# Patient Record
Sex: Female | Born: 1970 | Race: Asian | Hispanic: No | Marital: Married | Smoking: Current every day smoker
Health system: Southern US, Community
[De-identification: ages and names within clinical notes are randomized; demographics above are authoritative.]

## PROBLEM LIST (undated history)

## (undated) DIAGNOSIS — J45909 Unspecified asthma, uncomplicated: Secondary | ICD-10-CM

## (undated) DIAGNOSIS — F3281 Premenstrual dysphoric disorder: Secondary | ICD-10-CM

## (undated) DIAGNOSIS — N809 Endometriosis, unspecified: Secondary | ICD-10-CM

## (undated) DIAGNOSIS — Q998 Other specified chromosome abnormalities: Secondary | ICD-10-CM

## (undated) HISTORY — DX: Premenstrual dysphoric disorder: F32.81

## (undated) HISTORY — PX: ENDOMETRIAL ABLATION: SHX621

## (undated) HISTORY — DX: Endometriosis, unspecified: N80.9

## (undated) HISTORY — DX: Unspecified asthma, uncomplicated: J45.909

## (undated) HISTORY — DX: Other specified chromosome abnormalities: Q99.8

## (undated) HISTORY — PX: SHOULDER SURGERY: SHX246

---
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
## 1988-06-26 HISTORY — PX: TUBAL LIGATION: SHX77

## 1997-07-29 ENCOUNTER — Ambulatory Visit (HOSPITAL_COMMUNITY): Admission: RE | Admit: 1997-07-29 | Discharge: 1997-07-29 | Payer: Self-pay | Admitting: Gynecology

## 1999-04-24 ENCOUNTER — Emergency Department (HOSPITAL_COMMUNITY): Admission: EM | Admit: 1999-04-24 | Discharge: 1999-04-24 | Payer: Self-pay | Admitting: Emergency Medicine

## 2001-11-13 ENCOUNTER — Other Ambulatory Visit: Admission: RE | Admit: 2001-11-13 | Discharge: 2001-11-13 | Payer: Self-pay | Admitting: Gynecology

## 2003-02-24 ENCOUNTER — Other Ambulatory Visit: Admission: RE | Admit: 2003-02-24 | Discharge: 2003-02-24 | Payer: Self-pay | Admitting: Gynecology

## 2004-03-01 ENCOUNTER — Other Ambulatory Visit: Admission: RE | Admit: 2004-03-01 | Discharge: 2004-03-01 | Payer: Self-pay | Admitting: Gynecology

## 2005-08-14 ENCOUNTER — Other Ambulatory Visit: Admission: RE | Admit: 2005-08-14 | Discharge: 2005-08-14 | Payer: Self-pay | Admitting: Gynecology

## 2007-02-15 ENCOUNTER — Other Ambulatory Visit: Admission: RE | Admit: 2007-02-15 | Discharge: 2007-02-15 | Payer: Self-pay | Admitting: Gynecology

## 2007-03-19 ENCOUNTER — Ambulatory Visit (HOSPITAL_COMMUNITY): Admission: RE | Admit: 2007-03-19 | Discharge: 2007-03-19 | Payer: Self-pay | Admitting: Gynecology

## 2007-04-08 ENCOUNTER — Ambulatory Visit: Payer: Self-pay | Admitting: Oncology

## 2007-05-28 ENCOUNTER — Ambulatory Visit: Payer: Self-pay | Admitting: Oncology

## 2007-05-30 LAB — CBC WITH DIFFERENTIAL/PLATELET
Basophils Absolute: 0.1 10*3/uL (ref 0.0–0.1)
HCT: 37 % (ref 34.8–46.6)
MCH: 21.8 pg — ABNORMAL LOW (ref 26.0–34.0)
MCHC: 32.9 g/dL (ref 32.0–36.0)
MCV: 66.3 fL — ABNORMAL LOW (ref 81.0–101.0)
NEUT#: 7.1 10*3/uL — ABNORMAL HIGH (ref 1.5–6.5)
NEUT%: 62.7 % (ref 39.6–76.8)
Platelets: 346 10*3/uL (ref 145–400)
WBC: 11.3 10*3/uL — ABNORMAL HIGH (ref 3.9–10.0)

## 2007-05-30 LAB — MORPHOLOGY

## 2007-05-31 LAB — COMPREHENSIVE METABOLIC PANEL
ALT: 11 U/L (ref 0–35)
Albumin: 4.5 g/dL (ref 3.5–5.2)
BUN: 10 mg/dL (ref 6–23)
Calcium: 9 mg/dL (ref 8.4–10.5)
Chloride: 106 mEq/L (ref 96–112)
Creatinine, Ser: 0.82 mg/dL (ref 0.40–1.20)
Potassium: 3.9 mEq/L (ref 3.5–5.3)
Sodium: 138 mEq/L (ref 135–145)
Total Bilirubin: 0.6 mg/dL (ref 0.3–1.2)
Total Protein: 8.3 g/dL (ref 6.0–8.3)

## 2007-05-31 LAB — ANA: Anti Nuclear Antibody(ANA): NEGATIVE

## 2007-05-31 LAB — SEDIMENTATION RATE: Sed Rate: 21 mm/hr (ref 0–22)

## 2007-12-03 ENCOUNTER — Ambulatory Visit: Payer: Self-pay | Admitting: Oncology

## 2007-12-06 LAB — CBC WITH DIFFERENTIAL/PLATELET
BASO%: 0.3 % (ref 0.0–2.0)
Basophils Absolute: 0 10e3/uL (ref 0.0–0.1)
EOS%: 2.4 % (ref 0.0–7.0)
Eosinophils Absolute: 0.3 10e3/uL (ref 0.0–0.5)
HCT: 38 % (ref 34.8–46.6)
HGB: 12.6 g/dL (ref 11.6–15.9)
LYMPH%: 26.3 % (ref 14.0–48.0)
MCH: 22 pg — ABNORMAL LOW (ref 26.0–34.0)
MCHC: 33.2 g/dL (ref 32.0–36.0)
MCV: 66.2 fL — ABNORMAL LOW (ref 81.0–101.0)
MONO#: 0.9 10e3/uL (ref 0.1–0.9)
MONO%: 6.7 % (ref 0.0–13.0)
NEUT#: 8.2 10e3/uL — ABNORMAL HIGH (ref 1.5–6.5)
NEUT%: 64.3 % (ref 39.6–76.8)
Platelets: 359 10e3/uL (ref 145–400)
RBC: 5.75 10e6/uL — ABNORMAL HIGH (ref 3.70–5.32)
RDW: 14.3 % (ref 11.3–14.5)
WBC: 12.7 10e3/uL — ABNORMAL HIGH (ref 3.9–10.0)
lymph#: 3.3 10e3/uL (ref 0.9–3.3)

## 2007-12-10 LAB — IRON AND TIBC: TIBC: 331 ug/dL (ref 250–470)

## 2008-01-13 ENCOUNTER — Ambulatory Visit: Payer: Self-pay | Admitting: Oncology

## 2008-05-20 ENCOUNTER — Other Ambulatory Visit: Admission: RE | Admit: 2008-05-20 | Discharge: 2008-05-20 | Payer: Self-pay | Admitting: Gynecology

## 2008-05-20 ENCOUNTER — Encounter: Payer: Self-pay | Admitting: Gynecology

## 2008-05-20 ENCOUNTER — Ambulatory Visit: Payer: Self-pay | Admitting: Gynecology

## 2008-12-08 ENCOUNTER — Ambulatory Visit: Payer: Self-pay | Admitting: Family Medicine

## 2008-12-08 DIAGNOSIS — R7301 Impaired fasting glucose: Secondary | ICD-10-CM | POA: Insufficient documentation

## 2008-12-08 DIAGNOSIS — R0789 Other chest pain: Secondary | ICD-10-CM | POA: Insufficient documentation

## 2008-12-09 ENCOUNTER — Ambulatory Visit: Payer: Self-pay | Admitting: Family Medicine

## 2008-12-11 ENCOUNTER — Telehealth (INDEPENDENT_AMBULATORY_CARE_PROVIDER_SITE_OTHER): Payer: Self-pay | Admitting: *Deleted

## 2008-12-11 LAB — CONVERTED CEMR LAB
BUN: 9 mg/dL (ref 6–23)
Basophils Relative: 0.3 % (ref 0.0–3.0)
CO2: 24 meq/L (ref 19–32)
Chloride: 102 meq/L (ref 96–112)
Cholesterol: 121 mg/dL (ref 0–200)
Creatinine, Ser: 0.7 mg/dL (ref 0.4–1.2)
Eosinophils Absolute: 0.1 10*3/uL (ref 0.0–0.7)
Eosinophils Relative: 1.8 % (ref 0.0–5.0)
GFR calc non Af Amer: 98.28 mL/min (ref >60)
Glucose, Bld: 103 mg/dL — ABNORMAL HIGH (ref 70–99)
HCT: 38.6 % (ref 36.0–46.0)
HDL: 26.6 mg/dL — ABNORMAL LOW (ref >39.00)
LDL Cholesterol: 65 mg/dL (ref 0–99)
Lymphs Abs: 2.2 10*3/uL (ref 0.7–4.0)
Neutro Abs: 5.3 10*3/uL (ref 1.4–7.7)
Platelets: 255 10*3/uL (ref 150.0–400.0)
Potassium: 4.2 meq/L (ref 3.5–5.1)
RDW: 13.1 % (ref 11.5–14.6)
Sodium: 139 meq/L (ref 135–145)
TSH: 1.48 microintl units/mL (ref 0.35–5.50)
Total Bilirubin: 0.7 mg/dL (ref 0.3–1.2)
Total CHOL/HDL Ratio: 5
Total Protein: 8.1 g/dL (ref 6.0–8.3)
Triglycerides: 146 mg/dL (ref 0.0–149.0)
VLDL: 29.2 mg/dL (ref 0.0–40.0)

## 2008-12-22 ENCOUNTER — Ambulatory Visit: Payer: Self-pay | Admitting: Internal Medicine

## 2008-12-22 DIAGNOSIS — F17200 Nicotine dependence, unspecified, uncomplicated: Secondary | ICD-10-CM

## 2008-12-22 DIAGNOSIS — R519 Headache, unspecified: Secondary | ICD-10-CM | POA: Insufficient documentation

## 2008-12-22 DIAGNOSIS — R51 Headache: Secondary | ICD-10-CM | POA: Insufficient documentation

## 2008-12-22 DIAGNOSIS — R42 Dizziness and giddiness: Secondary | ICD-10-CM | POA: Insufficient documentation

## 2009-09-15 ENCOUNTER — Ambulatory Visit: Payer: Self-pay | Admitting: Gynecology

## 2009-09-15 ENCOUNTER — Other Ambulatory Visit: Admission: RE | Admit: 2009-09-15 | Discharge: 2009-09-15 | Payer: Self-pay | Admitting: Gynecology

## 2009-09-21 ENCOUNTER — Ambulatory Visit (HOSPITAL_COMMUNITY): Admission: RE | Admit: 2009-09-21 | Discharge: 2009-09-21 | Payer: Self-pay | Admitting: Gynecology

## 2009-09-21 IMAGING — MG MM DIGITAL SCREENING
4 series · 4 of 4 positions shown · non-contrast
Comparison: none

DG SCREEN MAMMOGRAM BILATERAL
Bilateral CC and MLO view(s) were taken.
Technologist: SAMPA, RT, RM

DIGITAL SCREENING MAMMOGRAM WITH CAD:
There are scattered fibroglandular densities.  No masses or malignant type calcifications are 
identified.
Images were processed with CAD.

[R CC]
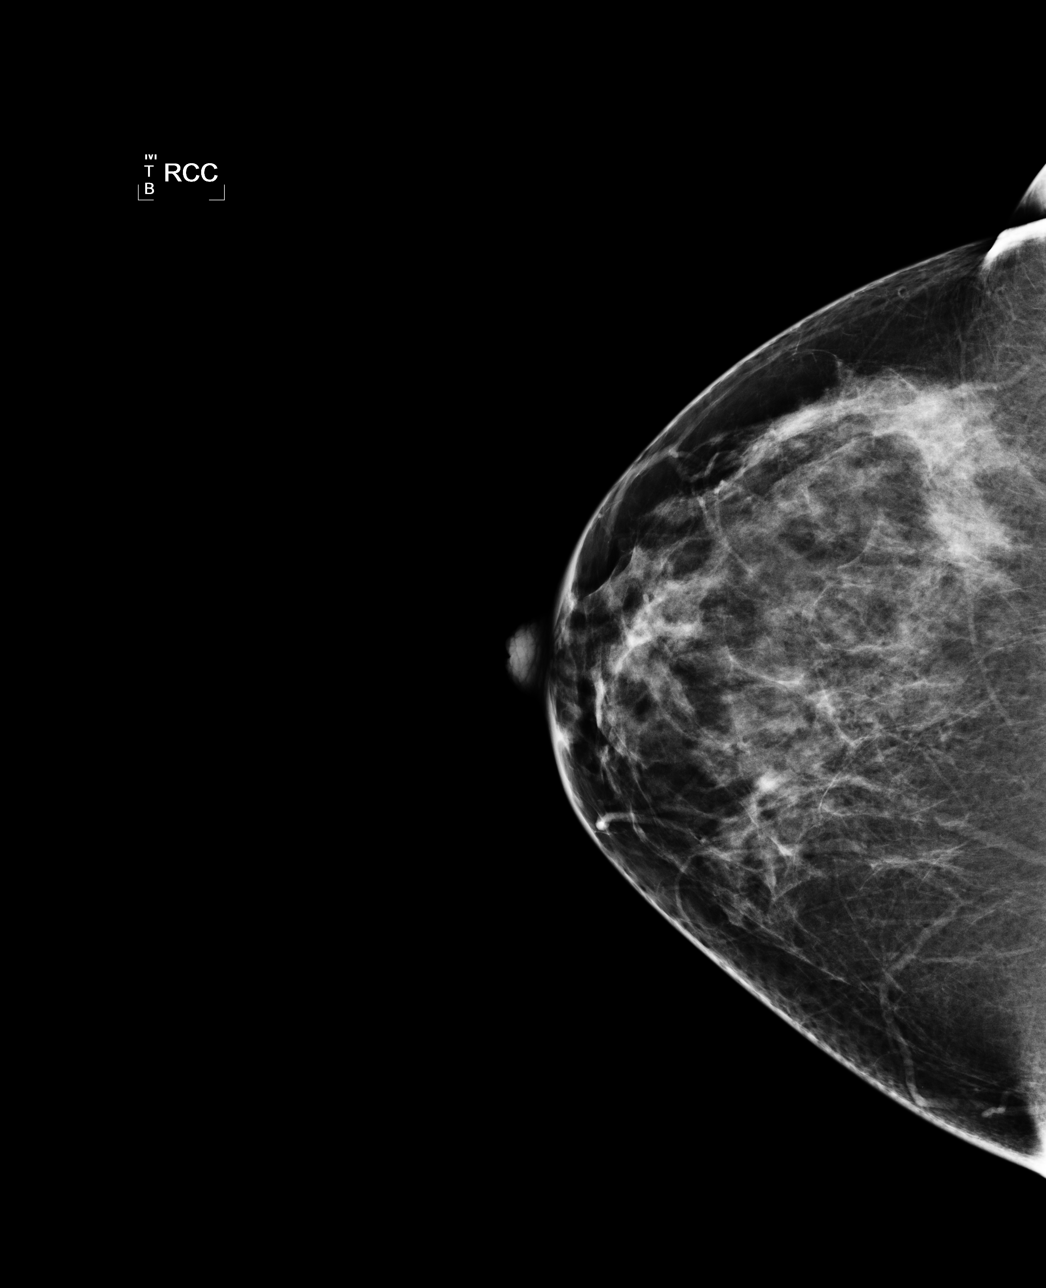

[R MLO]
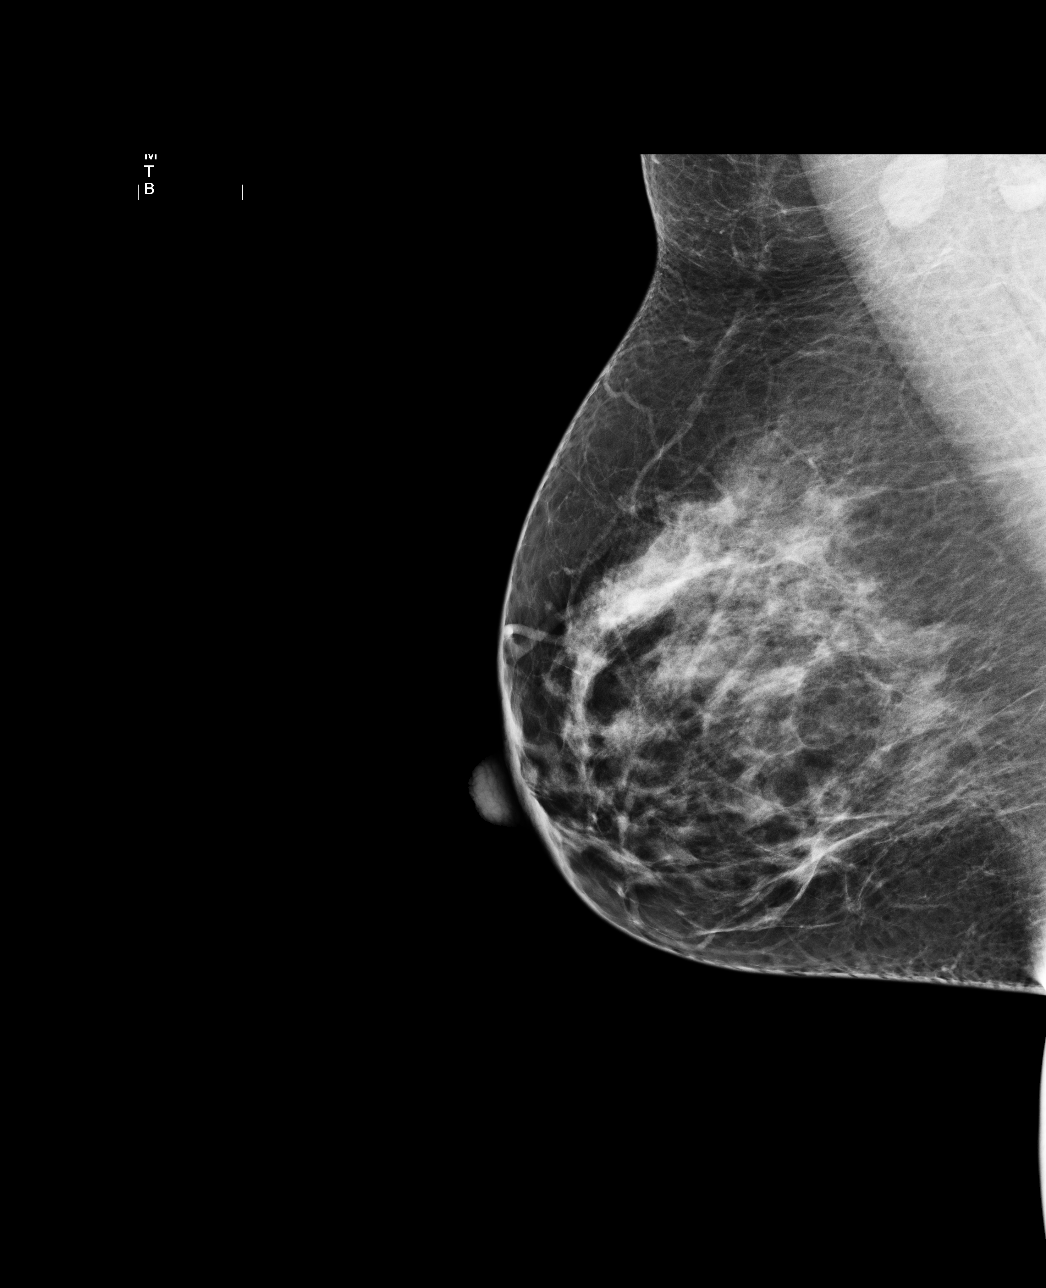

[L CC]
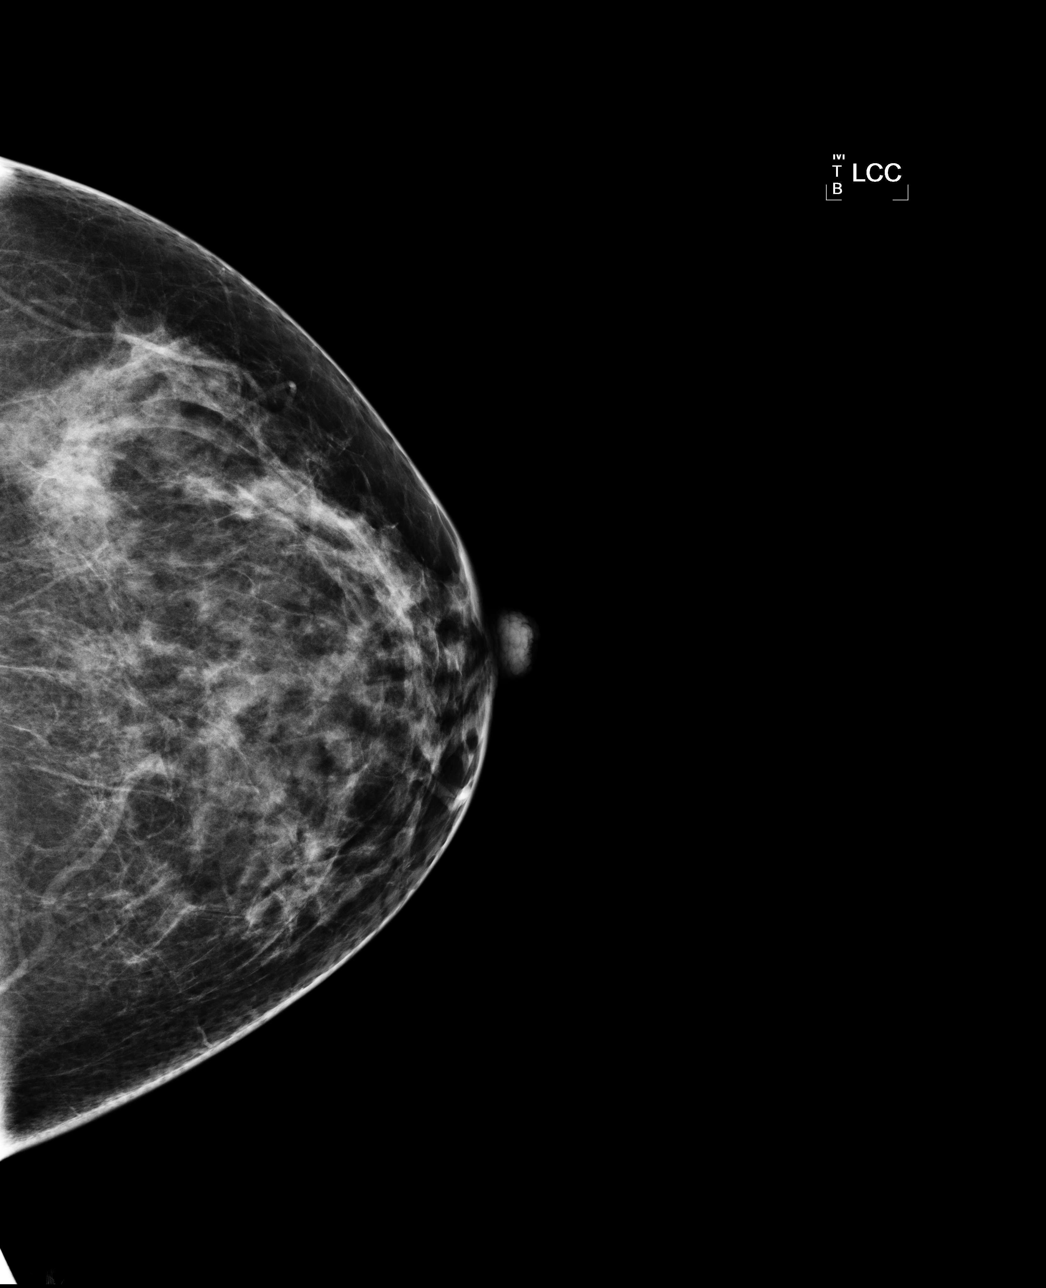

[L MLO]
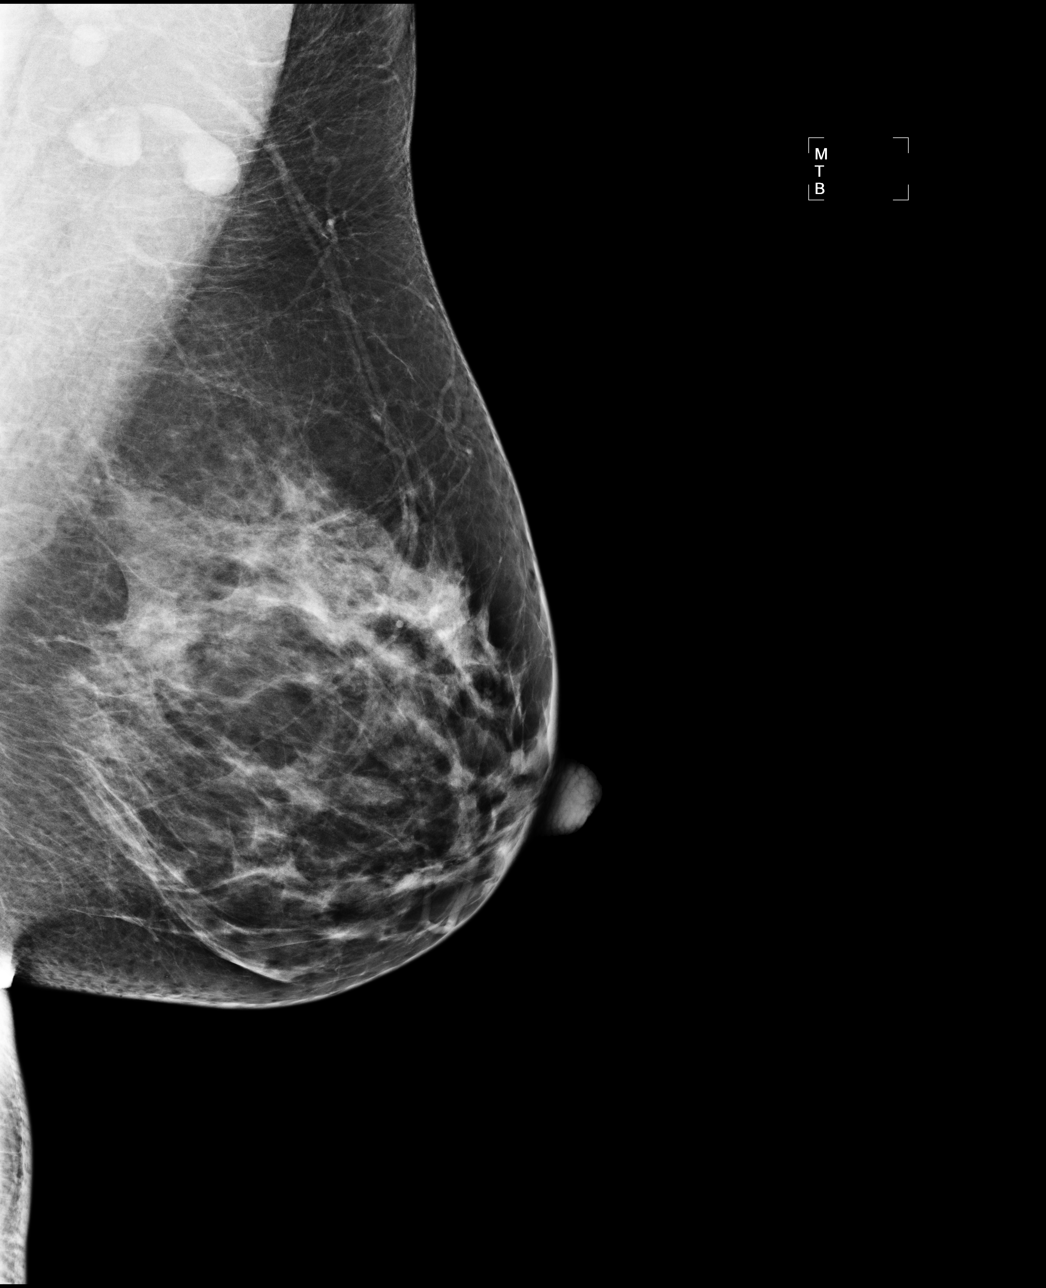

[4 of 4 positions shown; findings below may reference images not displayed]

IMPRESSION: No specific mammographic evidence of malignancy.  Next screening mammogram is recommended in one 
year.

A result letter of this screening mammogram will be mailed directly to the patient.

ASSESSMENT: Negative - BI-RADS 1

Screening mammogram in 1 year.
,

## 2010-07-18 ENCOUNTER — Encounter: Payer: Self-pay | Admitting: Internal Medicine

## 2010-09-26 ENCOUNTER — Encounter: Payer: Self-pay | Admitting: Gynecology

## 2010-10-17 ENCOUNTER — Other Ambulatory Visit (HOSPITAL_COMMUNITY)
Admission: RE | Admit: 2010-10-17 | Discharge: 2010-10-17 | Disposition: A | Discharge status: Home / Self Care | Payer: Private Health Insurance - Indemnity | Source: Ambulatory Visit | Attending: Gynecology | Admitting: Gynecology

## 2010-10-17 ENCOUNTER — Encounter (INDEPENDENT_AMBULATORY_CARE_PROVIDER_SITE_OTHER): Payer: Private Health Insurance - Indemnity | Admitting: Gynecology

## 2010-10-17 ENCOUNTER — Other Ambulatory Visit: Payer: Self-pay | Admitting: Gynecology

## 2010-10-17 DIAGNOSIS — R635 Abnormal weight gain: Secondary | ICD-10-CM

## 2010-10-17 DIAGNOSIS — Z01419 Encounter for gynecological examination (general) (routine) without abnormal findings: Secondary | ICD-10-CM

## 2010-10-17 DIAGNOSIS — Z13220 Encounter for screening for lipoid disorders: Secondary | ICD-10-CM

## 2010-10-17 DIAGNOSIS — Z833 Family history of diabetes mellitus: Secondary | ICD-10-CM

## 2010-10-17 DIAGNOSIS — N942 Vaginismus: Secondary | ICD-10-CM

## 2010-10-17 DIAGNOSIS — N831 Corpus luteum cyst of ovary, unspecified side: Secondary | ICD-10-CM

## 2010-10-17 DIAGNOSIS — Z124 Encounter for screening for malignant neoplasm of cervix: Secondary | ICD-10-CM | POA: Insufficient documentation

## 2011-03-02 ENCOUNTER — Other Ambulatory Visit: Payer: Self-pay | Admitting: Gynecology

## 2011-03-02 DIAGNOSIS — Z1231 Encounter for screening mammogram for malignant neoplasm of breast: Secondary | ICD-10-CM

## 2011-03-09 ENCOUNTER — Ambulatory Visit (HOSPITAL_COMMUNITY)
Admission: RE | Admit: 2011-03-09 | Discharge: 2011-03-09 | Disposition: A | Discharge status: Home / Self Care | Payer: Private Health Insurance - Indemnity | Source: Ambulatory Visit | Attending: Gynecology | Admitting: Gynecology

## 2011-03-09 DIAGNOSIS — Z1231 Encounter for screening mammogram for malignant neoplasm of breast: Secondary | ICD-10-CM | POA: Insufficient documentation

## 2012-05-30 ENCOUNTER — Other Ambulatory Visit: Payer: Self-pay | Admitting: Gynecology

## 2012-05-30 DIAGNOSIS — Z1231 Encounter for screening mammogram for malignant neoplasm of breast: Secondary | ICD-10-CM

## 2012-06-05 ENCOUNTER — Encounter: Payer: Self-pay | Admitting: Anesthesiology

## 2012-06-07 ENCOUNTER — Encounter: Payer: Private Health Insurance - Indemnity | Admitting: Gynecology

## 2012-06-10 ENCOUNTER — Ambulatory Visit (INDEPENDENT_AMBULATORY_CARE_PROVIDER_SITE_OTHER): Payer: Managed Care, Other (non HMO) | Admitting: Gynecology

## 2012-06-10 ENCOUNTER — Encounter: Payer: Self-pay | Admitting: Gynecology

## 2012-06-10 ENCOUNTER — Other Ambulatory Visit (HOSPITAL_COMMUNITY)
Admission: RE | Admit: 2012-06-10 | Discharge: 2012-06-10 | Disposition: A | Discharge status: Home / Self Care | Payer: Managed Care, Other (non HMO) | Source: Ambulatory Visit | Attending: Gynecology | Admitting: Gynecology

## 2012-06-10 VITALS — BP 110/72 | Ht 63.0 in | Wt 179.0 lb

## 2012-06-10 DIAGNOSIS — Z01419 Encounter for gynecological examination (general) (routine) without abnormal findings: Secondary | ICD-10-CM | POA: Insufficient documentation

## 2012-06-10 DIAGNOSIS — F419 Anxiety disorder, unspecified: Secondary | ICD-10-CM | POA: Insufficient documentation

## 2012-06-10 DIAGNOSIS — Z1151 Encounter for screening for human papillomavirus (HPV): Secondary | ICD-10-CM | POA: Insufficient documentation

## 2012-06-10 DIAGNOSIS — Z833 Family history of diabetes mellitus: Secondary | ICD-10-CM

## 2012-06-10 DIAGNOSIS — F329 Major depressive disorder, single episode, unspecified: Secondary | ICD-10-CM | POA: Insufficient documentation

## 2012-06-10 DIAGNOSIS — Q999 Chromosomal abnormality, unspecified: Secondary | ICD-10-CM | POA: Insufficient documentation

## 2012-06-10 DIAGNOSIS — Z23 Encounter for immunization: Secondary | ICD-10-CM

## 2012-06-10 DIAGNOSIS — F411 Generalized anxiety disorder: Secondary | ICD-10-CM

## 2012-06-10 MED ORDER — FLUOXETINE HCL 20 MG PO CAPS: 20.0000 mg | ORAL_CAPSULE | Freq: Every day | ORAL | Status: DC

## 2012-06-10 NOTE — Progress Notes (Signed)
Monica Lewis 18-Jun-1971 [id number]   History:    41 y.o.  for annual gyn exam who according to her record she has not been seen the office since 2012. We are in the process of obtaining her records that were sent for since it was rather thick and we only have part of her chart. She does state that many years ago she had a LEEP cervical conization for dysplasia and this will need to be reviewed. She does have a prior history of tubal sterilization procedure. She has had several family members and friends who have passed away this year and she has been somewhat depressed and anxious. 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 She has no suicidal ideation and has good family support. She was weighing 197 and is down to 179. She does have family history of diabetes. She has an aunt and first cousin who have had breast cancer. She does her monthly self breast examination. She is scheduled for a mammogram in the next couple weeks. She is going to have an employment labs done in next week. She has a history of endometrial ablation (hydrothermal ablation) several years ago.  Past medical history,surgical history, family history and social history were all reviewed and documented in the EPIC chart.  Gynecologic History Patient's last menstrual period was 05/25/2012. Contraception: tubal ligation Last Pap: 2012. Results were: normal Last mammogram: 2012. Results were: normal  Obstetric History OB History    Grav Para Term Preterm Abortions TAB SAB Ect Mult Living   4 3   1     1      # Outc Date GA Lbr Len/2nd Wgt Sex Del Anes PTL Lv   1 PAR         SB   2 PAR         SB   3 PAR            4 ABT                ROS: A ROS was performed and pertinent positives and negatives are included in the history.  GENERAL: No fevers or chills. HEENT: No change in vision, no earache, sore  throat or sinus congestion. NECK: No pain or stiffness. CARDIOVASCULAR: No chest pain or pressure. No palpitations. PULMONARY: No shortness of breath, cough or wheeze. GASTROINTESTINAL: No abdominal pain, nausea, vomiting or diarrhea, melena or bright red blood per rectum. GENITOURINARY: No urinary frequency, urgency, hesitancy or dysuria. MUSCULOSKELETAL: No joint or muscle pain, no back pain, no recent trauma. DERMATOLOGIC: No rash, no itching, no lesions. ENDOCRINE: No polyuria, polydipsia, no heat or cold intolerance. No recent change in weight. HEMATOLOGICAL: No anemia or easy bruising or bleeding. NEUROLOGIC: No headache, seizures, numbness, tingling or weakness. PSYCHIATRIC: No depression, no loss of interest in normal activity or change in sleep pattern.     Exam: chaperone present  BP 110/72  Ht 5\' 3"  (1.6 m)  Wt 179 lb (81.194 kg)  BMI 31.71 kg/m2  LMP 05/25/2012  Body mass index is 31.71 kg/(m^2).  General appearance : Well developed well nourished female. No acute distress HEENT: Neck supple, trachea midline, no carotid bruits, no thyroidmegaly Lungs: Clear to auscultation, no rhonchi or wheezes, or rib retractions  Heart: Regular rate and rhythm, no murmurs or gallops Breast:Examined in sitting  and supine position were symmetrical in appearance, no palpable masses or tenderness,  no skin retraction, no nipple inversion, no nipple discharge, no skin discoloration, no axillary or supraclavicular lymphadenopathy Abdomen: no palpable masses or tenderness, no rebound or guarding Extremities: no edema or skin discoloration or tenderness  Pelvic:  Bartholin, Urethra, Skene Glands: Within normal limits             Vagina: No gross lesions or discharge  Cervix: No gross lesions or discharge  Uterus  anteverted, normal size, shape and consistency, non-tender and mobile  Adnexa  Without masses or tenderness  Anus and perineum  normal   Rectovaginal  normal sphincter tone without  palpated masses or tenderness             Hemoccult not done     Assessment/Plan:  41 y.o. female for annual exam with depression and anxiety and due to multiple family members and friends were passed away this past year. Patient with past history of PMDD had done well on Prozac 20 mg daily. Prescription refill for Prozac was provided. We discussed the new Pap smear screening guidelines. Pap smear will be drawn today. Records requested to review the degree of dysplasia from several years ago to update our records. She will stand is a copy of her employment labs to [place] for our records. She was reminded to do her monthly self breast examination and to followup with her mammogram in the next couple weeks. We discussed the detrimental effects of smoking that she has started to pick up again recently. We discussed importance of calcium vitamin D and regular exercise for osteoporosis prevention. Patient received her flu vaccine today.    Ok Edwards MD, 11:11 AM 06/10/2012

## 2012-06-10 NOTE — Patient Instructions (Signed)
Inactivated Influenza Vaccine What You Need to Know WHY GET VACCINATED?  Influenza ("flu") is a contagious disease.  It is caused by the influenza virus, which can be spread by coughing, sneezing, or nasal secretions.  Anyone can get influenza, but rates of infection are highest among children. For most people, symptoms last only a few days. They include:  Fever or chills.  Sore throat.  Muscle aches.  Fatigue.  Cough.  Headache.  Runny or stuffy nose. Other illnesses can have the same symptoms and are often mistaken for influenza. Young children, people 65 and older, pregnant women, and people with certain health conditions, such as heart, lung or kidney disease, or a weakened immune system can get much sicker. Flu can cause high fever and pneumonia, and make existing medical conditions worse. It can cause diarrhea and seizures in children. Each year thousands of people die from influenza and even more require hospitalization. By getting flu vaccine, you can protect yourself from influenza and may also avoid spreading influenza to others. INACTIVATED INFLUENZA VACCINE There are 2 types of influenza vaccine:  Inactivated (killed) vaccine, the "flu shot", is given by injection with a needle.  Live, attenuated (weakened) influenza vaccine is sprayed into the nostrils. This vaccine is described in a separate Vaccine Information Statement. A "high-dose" inactivated influenza vaccine is available for people 65 years of age and older. Ask your doctor for more information.  Influenza viruses are always changing, so annual vaccination is recommended. Each year scientists try to match the viruses in the vaccine to those most likely to cause flu that year. Flu vaccine will not prevent disease from other viruses, including flu viruses not contained in the vaccine. It takes up to 2 weeks for protection to develop after the shot. Protection lasts about 1 year. Some inactivated influenza vaccine  contains a preservative called thimerosal. Thimerosal-free influenza vaccine is available. Ask your doctor for more information. WHO SHOULD GET INACTIVATED INFLUENZA VACCINE AND WHEN? WHO  All people 6 months of age and older should get flu vaccine.  Vaccination is especially important for people at higher risk of severe influenza and their close contacts, including healthcare personnel and close contacts of children younger than 6 months. WHEN Getting the vaccine as soon as it is available. This should provide protection if the flu season comes early. You can get the vaccine as long as illness is occurring in your community. Influenza can occur at any time, but most influenza occurs from October through May. In recent seasons, most infections have occurred in January and February. Getting vaccinated in December, or even later, will still be beneficial in most years. Adults and older children need 1 dose of influenza vaccine each year. But some children younger than 9 years of age need 2 doses to be protected. Ask your doctor. Influenza vaccine may be given at the same time as other vaccines, including pneumococcal vaccine. SOME PEOPLE SHOULD NOT GET INACTIVATED INFLUENZA VACCINE OR SHOULD WAIT  Tell your doctor if you have any severe (life-threatening) allergies, including a severe allergy to eggs. A severe allergy to any vaccine component may be a reason not to get the vaccine. Allergic reactions to influenza vaccine are rare.  Tell your doctor if you ever had a severe reaction after a dose of influenza vaccine.  Tell your doctor if you ever had Guillain-Barr syndrome (a severe paralytic illness, also called GBS). Your doctor will help you decide whether the vaccine is recommended for you.  People who are   moderately or severely ill should usually wait until they recover before getting the flu vaccine. If you are ill, talk to your doctor about whether to reschedule the vaccination. People with  a mild illness can usually get the vaccine. WHAT ARE THE RISKS FROM INACTIVATED INFLUENZA VACCINE? A vaccine, like any medicine, could possibly cause serious problems, such as severe allergic reactions. The risk of a vaccine causing serious harm, or death, is extremely small. Serious problems from inactivated influenza vaccine are very rare. The viruses in inactivated influenza vaccine have been killed, so you cannot get influenza from the vaccine. Mild problems:  Soreness, redness, or swelling where the shot was given.  Hoarseness; sore, red or itchy eyes; cough.  Fever.  Aches.  Headache.  Itching.  Fatigue. If these problems occur, they usually begin soon after the shot and last 1 to 2 days. Moderate problems: Young children who get inactivated flu vaccine and pneumococcal vaccine (PCV13) at the same time appear to be at increased risk for seizures caused by fever. Ask your doctor for more information. Tell your doctor if a child who is getting flu vaccine has ever had a seizure. Severe problems:  Life-threatening allergic reactions from vaccines are very rare. If they do occur, it is usually within a few minutes to a few hours after the shot.  In 1976, a type of inactivated influenza (swine flu) vaccine was associated with Guillan-Barr syndrome (GBS). Since then, flu vaccines have not been clearly linked to GBS. However, if there is a risk of GBS from current flu vaccines, it would be no more than 1 or 2 cases per million people vaccinated. This is much lower than the risk of severe influenza, which can be prevented by vaccination. The safety of vaccines is always being monitored. For more information, visit:  www.cdc.gov/vaccinesafety/Vaccine_Monitoring/Index.html and  www.cdc.gov/vaccinesafety/Activities/Activities_Index.html One brand of inactivated flu vaccine, called Afluria, should not be given to children 8 years of age or younger, except in special circumstances. A  related vaccine was associated with fevers and fever-related seizures in young children in [place]. Your doctor can give you more information. WHAT IF THERE IS A SEVERE REACTION? What should I look for? Any unusual condition, such as a high fever or unusual behavior. Signs of a serious allergic reaction can include difficulty breathing, hoarseness or wheezing, hives, paleness, weakness, a fast heartbeat, or dizziness. What should I do?  Call a doctor, or get the person to a doctor right away.  Tell your doctor what happened, the date and time it happened, and when the vaccination was given.  Ask your doctor, nurse, or health department to report the reaction by filing a Vaccine Adverse Event Reporting System (VAERS) form. Or, you can file this report through the VAERS website at www.vaers.hhs.gov or by calling 1-800-822-7967. VAERS does not provide medical advice. THE NATIONAL VACCINE INJURY COMPENSATION PROGRAM The National Vaccine Injury Compensation Program (VICP) was created in 1986. People who believe they may have been injured by a vaccine can learn about the program and about filing a claim by calling 1-800-338-2382, or visiting the VICP website at www.hrsa.gov/vaccinecompensation HOW CAN I LEARN MORE?  Ask your doctor. They can give you the vaccine package insert or suggest other sources of information.  Call your local or state health department.  Contact the Centers for Disease Control and Prevention (CDC):  Call 1-800-232-4636 (1-800-CDC-INFO) or  Visit the CDC's website at www.cdc.gov/flu CDC Inactivated Influenza Vaccine VIS (12/26/10) Document Released: 04/06/2006 Document Revised: 12/12/2011 Document Reviewed:   12/26/2010 [employer] Patient Information 2013 [employer].  

## 2012-06-24 ENCOUNTER — Ambulatory Visit (HOSPITAL_COMMUNITY)
Admission: RE | Admit: 2012-06-24 | Discharge: 2012-06-24 | Disposition: A | Discharge status: Home / Self Care | Payer: Managed Care, Other (non HMO) | Source: Ambulatory Visit | Attending: Gynecology | Admitting: Gynecology

## 2012-06-24 DIAGNOSIS — Z1231 Encounter for screening mammogram for malignant neoplasm of breast: Secondary | ICD-10-CM

## 2012-12-16 ENCOUNTER — Telehealth: Payer: Self-pay | Admitting: *Deleted

## 2012-12-16 DIAGNOSIS — R7301 Impaired fasting glucose: Secondary | ICD-10-CM

## 2012-12-16 NOTE — Telephone Encounter (Signed)
Have her come in fasting and we will do a hemoglobin A1c which will giving an average of her blood sugars over the course of the past 3 months more accurately

## 2012-12-16 NOTE — Telephone Encounter (Signed)
Pt has a recent blood work done by [employer] [facility] thru [employer] (job) her glucose came back reading 109 this is with a 14 hour fasting with no food only drinking water. Last glucose reading done in Dec. 2013 was 93. She has no PCP, pt asked if you want to recheck here? Please advise

## 2012-12-17 NOTE — Telephone Encounter (Signed)
Pt informed with the below note, order placed, pt will come on 12/23/12 @ 8:45 am

## 2012-12-23 ENCOUNTER — Other Ambulatory Visit: Payer: Managed Care, Other (non HMO)

## 2012-12-23 DIAGNOSIS — R7301 Impaired fasting glucose: Secondary | ICD-10-CM

## 2012-12-23 LAB — HEMOGLOBIN A1C: Mean Plasma Glucose: 111 mg/dL (ref <117)

## 2013-01-03 ENCOUNTER — Other Ambulatory Visit: Payer: Self-pay | Admitting: Gynecology

## 2013-05-01 ENCOUNTER — Other Ambulatory Visit: Payer: Self-pay

## 2013-08-07 ENCOUNTER — Other Ambulatory Visit: Payer: Self-pay | Admitting: Gynecology

## 2013-09-04 ENCOUNTER — Encounter: Payer: Self-pay | Admitting: Gynecology

## 2013-09-06 ENCOUNTER — Other Ambulatory Visit: Payer: Self-pay | Admitting: Gynecology

## 2013-10-09 ENCOUNTER — Ambulatory Visit (INDEPENDENT_AMBULATORY_CARE_PROVIDER_SITE_OTHER): Payer: Managed Care, Other (non HMO) | Admitting: Gynecology

## 2013-10-09 ENCOUNTER — Encounter: Payer: Self-pay | Admitting: Gynecology

## 2013-10-09 ENCOUNTER — Other Ambulatory Visit (HOSPITAL_COMMUNITY)
Admission: RE | Admit: 2013-10-09 | Discharge: 2013-10-09 | Disposition: A | Discharge status: Home / Self Care | Payer: Managed Care, Other (non HMO) | Source: Ambulatory Visit | Attending: Gynecology | Admitting: Gynecology

## 2013-10-09 VITALS — BP 114/70 | Ht 63.75 in | Wt 178.0 lb

## 2013-10-09 DIAGNOSIS — Z1151 Encounter for screening for human papillomavirus (HPV): Secondary | ICD-10-CM | POA: Insufficient documentation

## 2013-10-09 DIAGNOSIS — Z01419 Encounter for gynecological examination (general) (routine) without abnormal findings: Secondary | ICD-10-CM | POA: Insufficient documentation

## 2013-10-09 DIAGNOSIS — Z87410 Personal history of cervical dysplasia: Secondary | ICD-10-CM

## 2013-10-09 DIAGNOSIS — Q999 Chromosomal abnormality, unspecified: Secondary | ICD-10-CM | POA: Insufficient documentation

## 2013-10-09 DIAGNOSIS — N951 Menopausal and female climacteric states: Secondary | ICD-10-CM | POA: Insufficient documentation

## 2013-10-09 LAB — CBC WITH DIFFERENTIAL/PLATELET
BASOS ABS: 0 10*3/uL (ref 0.0–0.1)
Basophils Relative: 0 % (ref 0–1)
Eosinophils Absolute: 0.7 10*3/uL (ref 0.0–0.7)
Eosinophils Relative: 6 % — ABNORMAL HIGH (ref 0–5)
HEMATOCRIT: 40.1 % (ref 36.0–46.0)
HEMOGLOBIN: 13.4 g/dL (ref 12.0–15.0)
LYMPHS PCT: 32 % (ref 12–46)
Lymphs Abs: 3.7 10*3/uL (ref 0.7–4.0)
MCH: 21.9 pg — AB (ref 26.0–34.0)
MCHC: 33.4 g/dL (ref 30.0–36.0)
MCV: 65.5 fL — AB (ref 78.0–100.0)
Monocytes Absolute: 0.7 10*3/uL (ref 0.1–1.0)
Monocytes Relative: 6 % (ref 3–12)
NEUTROS ABS: 6.6 10*3/uL (ref 1.7–7.7)
NEUTROS PCT: 56 % (ref 43–77)
Platelets: 352 10*3/uL (ref 150–400)
RBC: 6.12 MIL/uL — AB (ref 3.87–5.11)
RDW: 16 % — ABNORMAL HIGH (ref 11.5–15.5)
WBC: 11.7 10*3/uL — ABNORMAL HIGH (ref 4.0–10.5)

## 2013-10-09 LAB — COMPREHENSIVE METABOLIC PANEL
ALT: 18 U/L (ref 0–35)
AST: 19 U/L (ref 0–37)
Albumin: 4.7 g/dL (ref 3.5–5.2)
Alkaline Phosphatase: 56 U/L (ref 39–117)
BUN: 12 mg/dL (ref 6–23)
CO2: 27 mEq/L (ref 19–32)
Calcium: 9.7 mg/dL (ref 8.4–10.5)
Chloride: 102 mEq/L (ref 96–112)
Creat: 0.65 mg/dL (ref 0.50–1.10)
GLUCOSE: 81 mg/dL (ref 70–99)
POTASSIUM: 4 meq/L (ref 3.5–5.3)
Sodium: 138 mEq/L (ref 135–145)
Total Bilirubin: 0.4 mg/dL (ref 0.2–1.2)
Total Protein: 8.4 g/dL — ABNORMAL HIGH (ref 6.0–8.3)

## 2013-10-09 LAB — TSH: TSH: 1.312 u[IU]/mL (ref 0.350–4.500)

## 2013-10-09 LAB — FOLLICLE STIMULATING HORMONE: FSH: 27.6 m[IU]/mL

## 2013-10-09 MED ORDER — TRIAMCINOLONE ACETONIDE 55 MCG/ACT NA AERO: 2.0000 | INHALATION_SPRAY | Freq: Every day | NASAL | Status: AC

## 2013-10-09 MED ORDER — FLUOXETINE HCL 20 MG PO CAPS: 20.0000 mg | ORAL_CAPSULE | Freq: Every day | ORAL | Status: DC

## 2013-10-09 NOTE — Patient Instructions (Addendum)
Smoking Cessation Quitting smoking is important to your health and has many advantages. However, it is not always easy to quit since nicotine is a very addictive drug. Often times, people try 3 times or more before being able to quit. This document explains the best ways for you to prepare to quit smoking. Quitting takes hard work and a lot of effort, but you can do it. ADVANTAGES OF QUITTING SMOKING  You will live longer, feel better, and live better.  Your body will feel the impact of quitting smoking almost immediately.  Within 20 minutes, blood pressure decreases. Your pulse returns to its normal level.  After 8 hours, carbon monoxide levels in the blood return to normal. Your oxygen level increases.  After 24 hours, the chance of having a heart attack starts to decrease. Your breath, hair, and body stop smelling like smoke.  After 48 hours, damaged nerve endings begin to recover. Your sense of taste and smell improve.  After 72 hours, the body is virtually free of nicotine. Your bronchial tubes relax and breathing becomes easier.  After 2 to 12 weeks, lungs can hold more air. Exercise becomes easier and circulation improves.  The risk of having a heart attack, stroke, cancer, or lung disease is greatly reduced.  After 1 year, the risk of coronary heart disease is cut in half.  After 5 years, the risk of stroke falls to the same as a nonsmoker.  After 10 years, the risk of lung cancer is cut in half and the risk of other cancers decreases significantly.  After 15 years, the risk of coronary heart disease drops, usually to the level of a nonsmoker.  If you are pregnant, quitting smoking will improve your chances of having a healthy baby.  The people you live with, especially any children, will be healthier.  You will have extra money to spend on things other than cigarettes. QUESTIONS TO THINK ABOUT BEFORE ATTEMPTING TO QUIT You may want to talk about your answers with your  caregiver.  Why do you want to quit?  If you tried to quit in the past, what helped and what did not?  What will be the most difficult situations for you after you quit? How will you plan to handle them?  Who can help you through the tough times? Your family? Friends? A caregiver?  What pleasures do you get from smoking? What ways can you still get pleasure if you quit? Here are some questions to ask your caregiver:  How can you help me to be successful at quitting?  What medicine do you think would be best for me and how should I take it?  What should I do if I need more help?  What is smoking withdrawal like? How can I get information on withdrawal? GET READY  Set a quit date.  Change your environment by getting rid of all cigarettes, ashtrays, matches, and lighters in your home, car, or work. Do not let people smoke in your home.  Review your past attempts to quit. Think about what worked and what did not. GET SUPPORT AND ENCOURAGEMENT You have a better chance of being successful if you have help. You can get support in many ways.  Tell your family, friends, and co-workers that you are going to quit and need their support. Ask them not to smoke around you.  Get individual, group, or telephone counseling and support. Programs are available at local hospitals and health centers. Call your local health department for   information about programs in your area.  Spiritual beliefs and practices may help some smokers quit.  Download a "quit meter" on your computer to keep track of quit statistics, such as how long you have gone without smoking, cigarettes not smoked, and money saved.  Get a self-help book about quitting smoking and staying off of tobacco. LEARN NEW SKILLS AND BEHAVIORS  Distract yourself from urges to smoke. Talk to someone, go for a walk, or occupy your time with a task.  Change your normal routine. Take a different route to work. Drink tea instead of coffee.  Eat breakfast in a different place.  Reduce your stress. Take a hot bath, exercise, or read a book.  Plan something enjoyable to do every day. Reward yourself for not smoking.  Explore interactive web-based programs that specialize in helping you quit. GET MEDICINE AND USE IT CORRECTLY Medicines can help you stop smoking and decrease the urge to smoke. Combining medicine with the above behavioral methods and support can greatly increase your chances of successfully quitting smoking.  Nicotine replacement therapy helps deliver nicotine to your body without the negative effects and risks of smoking. Nicotine replacement therapy includes nicotine gum, lozenges, inhalers, nasal sprays, and skin patches. Some may be available over-the-counter and others require a prescription.  Antidepressant medicine helps people abstain from smoking, but how this works is unknown. This medicine is available by prescription.  Nicotinic receptor partial agonist medicine simulates the effect of nicotine in your brain. This medicine is available by prescription. Ask your caregiver for advice about which medicines to use and how to use them based on your health history. Your caregiver will tell you what side effects to look out for if you choose to be on a medicine or therapy. Carefully read the information on the package. Do not use any other product containing nicotine while using a nicotine replacement product.  RELAPSE OR DIFFICULT SITUATIONS Most relapses occur within the first 3 months after quitting. Do not be discouraged if you start smoking again. Remember, most people try several times before finally quitting. You may have symptoms of withdrawal because your body is used to nicotine. You may crave cigarettes, be irritable, feel very hungry, cough often, get headaches, or have difficulty concentrating. The withdrawal symptoms are only temporary. They are strongest when you first quit, but they will go away within  10 14 days. To reduce the chances of relapse, try to:  Avoid drinking alcohol. Drinking lowers your chances of successfully quitting.  Reduce the amount of caffeine you consume. Once you quit smoking, the amount of caffeine in your body increases and can give you symptoms, such as a rapid heartbeat, sweating, and anxiety.  Avoid smokers because they can make you want to smoke.  Do not let weight gain distract you. Many smokers will gain weight when they quit, usually less than 10 pounds. Eat a healthy diet and stay active. You can always lose the weight gained after you quit.  Find ways to improve your mood other than smoking. FOR MORE INFORMATION  www.smokefree.gov  Document Released: 06/06/2001 Document Revised: 12/12/2011 Document Reviewed: 09/21/2011 [facility] Patient Information 2014 [employer], [place]. Triamcinolone nasal spray What is this medicine? TRIAMCINOLONE (trye am [place]) nasal spray is a corticosteroid. It is used to treat the nasal symptoms of seasonal and year round allergies. This medicine may be used for other purposes; ask your health care provider or pharmacist if you have questions. COMMON BRAND NAME(S): Nasacort AQ, Nasacort What  should I tell my health care provider before I take this medicine? They need to know if you have any of these conditions: -infection, like tuberculosis, herpes, or fungal infection -recent surgery or injury of nose or sinuses -taking corticosteroids by mouth -an unusual or allergic reaction to triamcinolone, corticosteroids, other medicines, foods, dyes, or preservatives -pregnant or trying to get pregnant -breast-feeding How should I use this medicine? This medicine is for use in the nose. Follow the directions on your prescription label. This medicine works best if used regularly. Do not use more often than directed. Make sure that you are using your nasal spray correctly. Ask you doctor or health care provider if you have any  questions. Talk to your pediatrician regarding the use of this medicine in children. While this drug may be prescribed for children as young as 522 years of age for selected conditions, precautions do apply. Overdosage: If you think you have taken too much of this medicine contact a poison control center or emergency room at once. NOTE: This medicine is only for you. Do not share this medicine with others. What if I miss a dose? If you miss a dose, take it as soon as you can. If it is almost time for your next dose, take only that dose. Do not take double or extra doses. What may interact with this medicine? Interactions are not expected. This list may not describe all possible interactions. Give your health care provider a list of all the medicines, herbs, non-prescription drugs, or dietary supplements you use. Also tell them if you smoke, drink alcohol, or use illegal drugs. Some items may interact with your medicine. What should I watch for while using this medicine? Check with your doctor or health care professional if your symptoms do not improve in 1 week of regular use or if they get worse. Do not come in contact with people who have chickenpox or the measles while you are taking this medicine. If you do, call your doctor right away. What side effects may I notice from receiving this medicine? Side effects that you should report to your doctor or health care professional as soon as possible: -allergic reactions like skin rash, itching or hives, swelling of the face, lips, or tongue -change in vision -dizziness -infection -nosebleed, burning in the nose -trouble breathing, wheezing -unusual bruising -white patches or sores in the nose Side effects that usually do not require medical attention (report to your doctor or health care professional if they continue or are bothersome): -congestion -cough -headache -nausea -runny nose -sneezing This list may not describe all possible side  effects. Call your doctor for medical advice about side effects. You may report side effects to FDA at 1-800-FDA-1088. Where should I keep my medicine? Keep out of the reach of children. Store at room temperature between 20 and 25 degrees C (68 and 77 degrees F). Throw away the canister after 120 sprays or after the expiration date, whichever comes first. NOTE: This sheet is a summary. It may not cover all possible information. If you have questions about this medicine, talk to your doctor, pharmacist, or health care provider.  2014, [employer]vier/[employer] Standard. (2008-06-04 15:44:00)

## 2013-10-09 NOTE — Progress Notes (Signed)
Monica Lewis 09/01/1970 [id number]   History:    43 y.o.  for annual gyn exam with some basal motor symptoms reported as well as seasonal allergy.  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 Subsequent Pap smears have been normal.  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 Patient's had hydrothermal ablation for menorrhagia in the past as well after her tubal ligation.  Patient has suffered from PMDD and has done well on Prozac 20 mg daily.  She does have family history of diabetes. She has an aunt and first cousin who have had breast cancer. She does her monthly self breast examination   Past medical history,surgical history, family history and social history were all reviewed and documented in the EPIC chart.  Gynecologic History Patient's last menstrual period was 09/24/2013. Contraception: tubal ligation Last Pap: 2013. Results were: normal Last mammogram 2014:  . Results were: normal  Obstetric History OB History  Gravida Para Term Preterm AB SAB TAB Ectopic Multiple Living  4 3   1     1     # Outcome Date GA Lbr Len/2nd Weight Sex Delivery Anes PTL Lv  4 ABT           3 PAR           2 PAR         SB  1 PAR         SB       ROS: A ROS was performed and pertinent positives and negatives are included in the history.  GENERAL: No fevers or chills. HEENT: No change in vision, no earache, sore throat or sinus congestion. NECK: No pain or stiffness. CARDIOVASCULAR: No chest pain or pressure. No palpitations. PULMONARY: No shortness of breath, cough or wheeze. GASTROINTESTINAL: No abdominal pain, nausea, vomiting or diarrhea, melena or bright red blood per rectum. GENITOURINARY: No urinary frequency, urgency, hesitancy or dysuria. MUSCULOSKELETAL: No joint or muscle pain, no back pain, no recent trauma. DERMATOLOGIC: No  rash, no itching, no lesions. ENDOCRINE: No polyuria, polydipsia, no heat or cold intolerance. No recent change in weight. HEMATOLOGICAL: No anemia or easy bruising or bleeding. NEUROLOGIC: No headache, seizures, numbness, tingling or weakness. PSYCHIATRIC: No depression, no loss of interest in normal activity or change in sleep pattern.     Exam: chaperone present  BP 114/70  Ht 5' 3.75" (1.619 m)  Wt 178 lb (80.74 kg)  BMI 30.80 kg/m2  LMP 09/24/2013  Body mass index is 30.8 kg/(m^2).  General appearance : Well developed well nourished female. No acute distress HEENT: Neck supple, trachea midline, no carotid bruits, no thyroidmegaly Lungs: Clear to auscultation, no rhonchi or wheezes, or rib retractions  Heart: Regular rate and rhythm, no murmurs or gallops Breast:Examined in sitting and supine position were symmetrical in appearance, no palpable masses or tenderness,  no skin retraction, no nipple inversion, no nipple discharge, no skin discoloration, no axillary or supraclavicular lymphadenopathy Abdomen: no palpable masses or tenderness, no rebound or guarding Extremities: no edema or skin discoloration or tenderness  Pelvic:  Bartholin, Urethra, Skene Glands: Within normal limits             Vagina: No gross lesions or discharge  Cervix: No gross lesions or discharge  Uterus  anteverted, normal size, shape and consistency, non-tender and mobile  Adnexa  Without masses or tenderness  Anus and perineum  normal   Rectovaginal  normal sphincter tone without palpated masses or tenderness             Hemoccult not indicated     Assessment/Plan:  43 y.o. female for annual exam done well with seasonal allergies will be placed on Nasacort to apply 2 squirts each nostril daily when necessary. She will get her fasting lipid profile at work and sent as a result. We will do her Pap smear today along with her CBC, urinalysis, TSH [facility] and she started to have some hot  flushes. She was reminded her monthly breast exams. We discussed importance of calcium vitamin D for osteoporosis prevention.  Note: This dictation was prepared with  Dragon/digital dictation along withSmart phrase technology. Any transcriptional errors that result from this process are unintentional.   Ok EdwardsJuan H Fernandez MD, 10:40 AM 10/09/2013

## 2013-10-09 NOTE — Addendum Note (Signed)
Addended by: Richardson ChiquitoWILKINSON, Moniqua Engebretsen S on: 10/09/2013 02:51 PM   Modules accepted: Orders

## 2013-10-10 DIAGNOSIS — D563 Thalassemia minor: Secondary | ICD-10-CM | POA: Insufficient documentation

## 2013-10-10 LAB — URINALYSIS W MICROSCOPIC + REFLEX CULTURE
Bacteria, UA: NONE SEEN
Bilirubin Urine: NEGATIVE
Casts: NONE SEEN
Crystals: NONE SEEN
Glucose, UA: NEGATIVE mg/dL
Hgb urine dipstick: NEGATIVE
Ketones, ur: NEGATIVE mg/dL
Leukocytes, UA: NEGATIVE
Nitrite: NEGATIVE
Protein, ur: NEGATIVE mg/dL
Specific Gravity, Urine: <1.005 — ABNORMAL LOW (ref 1.005–1.030)
Squamous Epithelial / LPF: NONE SEEN
Urobilinogen, UA: 0.2 mg/dL (ref 0.0–1.0)
pH: 7 (ref 5.0–8.0)

## 2013-10-10 NOTE — Progress Notes (Signed)
No now that we have that note that clarifies thinks thank you

## 2013-10-14 ENCOUNTER — Encounter: Payer: Self-pay | Admitting: Gynecology

## 2013-10-15 ENCOUNTER — Telehealth: Payer: Self-pay

## 2013-10-15 NOTE — Telephone Encounter (Signed)
No, this will suffice. Thanks

## 2013-10-15 NOTE — Telephone Encounter (Signed)
Message copied by Keenan BachelorANNAS, Kordelia Severin R on Wed Oct 15, 2013 12:59 PM ------      Message from: Ok EdwardsFERNANDEZ, JUAN H      Created: Fri Oct 10, 2013  8:07 AM       We'll need to repeat CBC in 2 weeks WBC slightly elevated. ------

## 2013-10-15 NOTE — Telephone Encounter (Signed)
Patient said 5 years ago you sent her to Dr. Cyndie ChimeGranfortuna to be worked up for elevated WBC and this is actually normal for her. I called and had the notes sent over from that visit. It was actually 06/12/2007. Dr. Cyndie ChimeGranfortuna wrote "I reassured her at this point in time, what we are likely seeing is just a high-normal count. No further interventions necessary. 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 Cyndie ChimeGranfortuna wrote that all was stable and "At this point, I think she can be followed exclusively by her primary care physician". I will have these notes scanned in her chart.   Patient asked if you still wanted her to recheck lab?

## 2013-10-24 ENCOUNTER — Telehealth: Payer: Self-pay | Admitting: *Deleted

## 2013-10-24 MED ORDER — ALBUTEROL SULFATE HFA 108 (90 BASE) MCG/ACT IN AERS: 2.0000 | INHALATION_SPRAY | Freq: Four times a day (QID) | RESPIRATORY_TRACT | Status: DC | PRN

## 2013-10-24 NOTE — Telephone Encounter (Signed)
Who has been prescribing it before? PCP?

## 2013-10-24 NOTE — Telephone Encounter (Signed)
Pt was seen on 10/09/13 for annual forgot to ask if you would be willing to refill ProAir HFA inhaler? Please advise

## 2013-10-24 NOTE — Telephone Encounter (Signed)
Please go ahead and refill her air ProAi HFA inhlaer

## 2013-10-24 NOTE — Telephone Encounter (Signed)
rx sent, pt informed.  

## 2013-10-24 NOTE — Telephone Encounter (Signed)
Pt does not have PCP, usually goes to walk in clinic to get Rx which is more expensive. Pt said she only takes during allergic season.

## 2014-04-27 ENCOUNTER — Encounter: Payer: Self-pay | Admitting: Gynecology

## 2014-10-15 ENCOUNTER — Other Ambulatory Visit: Payer: Self-pay

## 2014-10-15 MED ORDER — FLUOXETINE HCL 20 MG PO CAPS: 20.0000 mg | ORAL_CAPSULE | Freq: Every day | ORAL | Status: DC

## 2014-11-02 ENCOUNTER — Other Ambulatory Visit: Payer: Self-pay | Admitting: Gynecology

## 2014-11-22 ENCOUNTER — Other Ambulatory Visit: Payer: Self-pay | Admitting: Gynecology

## 2014-11-24 NOTE — Telephone Encounter (Signed)
Needs overdue exam

## 2014-11-24 NOTE — Telephone Encounter (Signed)
See 10/24/2013 telephone encounter where you refilled this for her. No PCP.

## 2014-11-27 ENCOUNTER — Other Ambulatory Visit: Payer: Self-pay | Admitting: Gynecology

## 2014-11-30 ENCOUNTER — Other Ambulatory Visit: Payer: Self-pay | Admitting: Gynecology

## 2014-12-08 ENCOUNTER — Other Ambulatory Visit: Payer: Self-pay | Admitting: Gynecology

## 2014-12-08 NOTE — Telephone Encounter (Signed)
No refills. Patient overdue for annual exam

## 2014-12-08 NOTE — Telephone Encounter (Signed)
Rx called in 

## 2014-12-08 NOTE — Telephone Encounter (Signed)
Annual scheduled on 12/25/14. Please advise

## 2014-12-23 ENCOUNTER — Encounter: Payer: Self-pay | Admitting: Gynecology

## 2014-12-23 ENCOUNTER — Ambulatory Visit (INDEPENDENT_AMBULATORY_CARE_PROVIDER_SITE_OTHER): Payer: Managed Care, Other (non HMO) | Admitting: Gynecology

## 2014-12-23 ENCOUNTER — Other Ambulatory Visit (HOSPITAL_COMMUNITY)
Admission: RE | Admit: 2014-12-23 | Discharge: 2014-12-23 | Disposition: A | Discharge status: Home / Self Care | Payer: Managed Care, Other (non HMO) | Source: Ambulatory Visit | Attending: Gynecology | Admitting: Gynecology

## 2014-12-23 VITALS — BP 124/80 | Ht 63.0 in | Wt 193.0 lb

## 2014-12-23 DIAGNOSIS — N951 Menopausal and female climacteric states: Secondary | ICD-10-CM | POA: Diagnosis not present

## 2014-12-23 DIAGNOSIS — Z01419 Encounter for gynecological examination (general) (routine) without abnormal findings: Secondary | ICD-10-CM | POA: Insufficient documentation

## 2014-12-23 DIAGNOSIS — Z79890 Hormone replacement therapy (postmenopausal): Secondary | ICD-10-CM | POA: Diagnosis not present

## 2014-12-23 DIAGNOSIS — R232 Flushing: Secondary | ICD-10-CM

## 2014-12-23 LAB — COMPREHENSIVE METABOLIC PANEL
ALT: 11 U/L (ref 0–35)
AST: 13 U/L (ref 0–37)
Albumin: 3.9 g/dL (ref 3.5–5.2)
Alkaline Phosphatase: 59 U/L (ref 39–117)
BILIRUBIN TOTAL: 0.3 mg/dL (ref 0.2–1.2)
BUN: 4 mg/dL — ABNORMAL LOW (ref 6–23)
CO2: 24 mEq/L (ref 19–32)
Calcium: 8.6 mg/dL (ref 8.4–10.5)
Chloride: 104 mEq/L (ref 96–112)
Creat: 0.51 mg/dL (ref 0.50–1.10)
GLUCOSE: 98 mg/dL (ref 70–99)
Potassium: 3.7 mEq/L (ref 3.5–5.3)
SODIUM: 136 meq/L (ref 135–145)
Total Protein: 7.4 g/dL (ref 6.0–8.3)

## 2014-12-23 LAB — LIPID PANEL
Cholesterol: 165 mg/dL (ref 0–200)
HDL: 21 mg/dL — AB (ref >=46)
Total CHOL/HDL Ratio: 7.9 Ratio
Triglycerides: 729 mg/dL — ABNORMAL HIGH (ref <150)

## 2014-12-23 LAB — CBC WITH DIFFERENTIAL/PLATELET
BASOS PCT: 0 % (ref 0–1)
Basophils Absolute: 0 10*3/uL (ref 0.0–0.1)
Eosinophils Absolute: 0.3 10*3/uL (ref 0.0–0.7)
Eosinophils Relative: 3 % (ref 0–5)
HCT: 36.7 % (ref 36.0–46.0)
Hemoglobin: 11.7 g/dL — ABNORMAL LOW (ref 12.0–15.0)
LYMPHS PCT: 26 % (ref 12–46)
Lymphs Abs: 2.8 10*3/uL (ref 0.7–4.0)
MCH: 21.3 pg — AB (ref 26.0–34.0)
MCHC: 31.9 g/dL (ref 30.0–36.0)
MCV: 66.8 fL — AB (ref 78.0–100.0)
MPV: 10 fL (ref 8.6–12.4)
Monocytes Absolute: 0.5 10*3/uL (ref 0.1–1.0)
Monocytes Relative: 5 % (ref 3–12)
NEUTROS ABS: 7.1 10*3/uL (ref 1.7–7.7)
Neutrophils Relative %: 66 % (ref 43–77)
PLATELETS: 338 10*3/uL (ref 150–400)
RBC: 5.49 MIL/uL — AB (ref 3.87–5.11)
RDW: 16 % — AB (ref 11.5–15.5)
WBC: 10.8 10*3/uL — ABNORMAL HIGH (ref 4.0–10.5)

## 2014-12-23 LAB — TSH: TSH: 1.497 u[IU]/mL (ref 0.350–4.500)

## 2014-12-23 MED ORDER — ESTRADIOL 1 MG PO TABS: 1.0000 mg | ORAL_TABLET | Freq: Every day | ORAL | Status: AC

## 2014-12-23 MED ORDER — PROGESTERONE MICRONIZED 200 MG PO CAPS: 200.0000 mg | ORAL_CAPSULE | Freq: Every day | ORAL | Status: AC

## 2014-12-23 NOTE — Progress Notes (Signed)
Monica Lewis 09/01/1970 [id number]   History:    44 y.o.  for annual gyn exam with a complaint of daily worsening hot flashes. 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 Subsequent Pap smears have been normal.  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 Patient's had hydrothermal ablation for menorrhagia in the past as well after her tubal ligation.  Patient has suffered from PMDD and has done well on Prozac 20 mg daily.  She does have family history of diabetes. She has an aunt and first cousin who have had breast cancer. She does her monthly self breast examination   Past medical history,surgical history, family history and social history were all reviewed and documented in the EPIC chart.  Gynecologic History Patient's last menstrual period was 05/24/2014. Contraception: tubal ligation Last Pap: 2015. Results were: normal Last mammogram: 2015. Results were: normal  Obstetric History OB History  Gravida Para Term Preterm AB SAB TAB Ectopic Multiple Living  4 3   1     1     # Outcome Date GA Lbr Len/2nd Weight Sex Delivery Anes PTL Lv  4 AB           3 Para           2 Para         FD  1 Para         FD       ROS: A ROS was performed and pertinent positives and negatives are included in the history.  GENERAL: No fevers or chills. HEENT: No change in vision, no earache, sore throat or sinus congestion. NECK: No pain or stiffness. CARDIOVASCULAR: No chest pain or pressure. No palpitations. PULMONARY: No shortness of breath, cough or wheeze. GASTROINTESTINAL: No abdominal pain, nausea, vomiting or diarrhea, melena or bright red blood per rectum. GENITOURINARY: No urinary frequency, urgency, hesitancy or dysuria. MUSCULOSKELETAL: No joint or muscle pain, no back pain, no recent trauma. DERMATOLOGIC: No rash, no itching, no  lesions. ENDOCRINE: No polyuria, polydipsia, no heat or cold intolerance. No recent change in weight. HEMATOLOGICAL: No anemia or easy bruising or bleeding. NEUROLOGIC: No headache, seizures, numbness, tingling or weakness. PSYCHIATRIC: No depression, no loss of interest in normal activity or change in sleep pattern.     Exam: chaperone present  BP 124/80 mmHg  Ht 5\' 3"  (1.6 m)  Wt 193 lb (87.544 kg)  BMI 34.20 kg/m2  LMP 05/24/2014  Body mass index is 34.2 kg/(m^2).  General appearance : Well developed well nourished female. No acute distress HEENT: Eyes: no retinal hemorrhage or exudates,  Neck supple, trachea midline, no carotid bruits, no thyroidmegaly Lungs: Clear to auscultation, no rhonchi or wheezes, or rib retractions  Heart: Regular rate and rhythm, no murmurs or gallops Breast:Examined in sitting and supine position were symmetrical in appearance, no palpable masses or tenderness,  no skin retraction, no nipple inversion, no nipple discharge, no skin discoloration, no axillary or supraclavicular lymphadenopathy Abdomen: no palpable masses or tenderness, no rebound or guarding Extremities: no edema or skin discoloration or tenderness  Pelvic:  Bartholin, Urethra, Skene Glands: Within normal limits             Vagina: No gross lesions or discharge  Cervix: No gross lesions or discharge  Uterus  anteverted, normal size, shape and consistency, non-tender and mobile  Adnexa  Without masses or tenderness  Anus and perineum  normal   Rectovaginal  normal sphincter tone without palpated masses or tenderness             Hemoccult not indicated     Assessment/Plan:  44 y.o. female for annual exam with worsening vasomotor symptoms. We discussed at length the women's health initiative study. We discussed the risk and benefits and pros and cons of hormone replacement therapy. She states she occasionally smokes the electric cigarette and was counseled. She's ready to start on hormone  replacement therapy and taper off the Prozac. She is going to be prescribed Estrace 1 mg daily with the addition of Prometrium 200 mg for 12 days of the month. In over the course of the month wearing 1 to taper off her 20 mg Prozac. The following screening fasting blood work was ordered: Comprehensive metabolic panel, TSH, CBC, fasting lipid profile and urinalysis. Pap smear was done today. Requisition to schedule her mammogram was provided. Literature information on hormone replacement therapy was provided. We discussed importance of regular exercise as well as calcium vitamin D for osteoporosis prevention.   Ok Edwards MD, 8:50 AM 12/23/2014

## 2014-12-23 NOTE — Patient Instructions (Signed)
Menopause Menopause is the normal time of life when menstrual periods stop completely. Menopause is complete when you have missed 12 consecutive menstrual periods. It usually occurs between the ages of 48 years and 55 years. Very rarely does a woman develop menopause before the age of 40 years. At menopause, your ovaries stop producing the female hormones estrogen and progesterone. This can cause undesirable symptoms and also affect your health. Sometimes the symptoms may occur 4-5 years before the menopause begins. There is no relationship between menopause and:  Oral contraceptives.  Number of children you had.  Race.  The age your menstrual periods started (menarche). Heavy smokers and very thin women may develop menopause earlier in life. CAUSES  The ovaries stop producing the female hormones estrogen and progesterone.  Other causes include:  Surgery to remove both ovaries.  The ovaries stop functioning for no known reason.  Tumors of the pituitary gland in the brain.  Medical disease that affects the ovaries and hormone production.  Radiation treatment to the abdomen or pelvis.  Chemotherapy that affects the ovaries. SYMPTOMS   Hot flashes.  Night sweats.  Decrease in sex drive.  Vaginal dryness and thinning of the vagina causing painful intercourse.  Dryness of the skin and developing wrinkles.  Headaches.  Tiredness.  Irritability.  Memory problems.  Weight gain.  Bladder infections.  Hair growth of the face and chest.  Infertility. More serious symptoms include:  Loss of bone (osteoporosis) causing breaks (fractures).  Depression.  Hardening and narrowing of the arteries (atherosclerosis) causing heart attacks and strokes. DIAGNOSIS   When the menstrual periods have stopped for 12 straight months.  Physical exam.  Hormone studies of the blood. TREATMENT  There are many treatment choices and nearly as many questions about them. The  decisions to treat or not to treat menopausal changes is an individual choice made with your health care provider. Your health care provider can discuss the treatments with you. Together, you can decide which treatment will work best for you. Your treatment choices may include:   Hormone therapy (estrogen and progesterone).  Non-hormonal medicines.  Treating the individual symptoms with medicine (for example antidepressants for depression).  Herbal medicines that may help specific symptoms.  Counseling by a psychiatrist or psychologist.  Group therapy.  Lifestyle changes including:  Eating healthy.  Regular exercise.  Limiting caffeine and alcohol.  Stress management and meditation.  No treatment. HOME CARE INSTRUCTIONS   Take the medicine your health care provider gives you as directed.  Get plenty of sleep and rest.  Exercise regularly.  Eat a diet that contains calcium (good for the bones) and soy products (acts like estrogen hormone).  Avoid alcoholic beverages.  Do not smoke.  If you have hot flashes, dress in layers.  Take supplements, calcium, and vitamin D to strengthen bones.  You can use over-the-counter lubricants or moisturizers for vaginal dryness.  Group therapy is sometimes very helpful.  Acupuncture may be helpful in some cases. SEEK MEDICAL CARE IF:   You are not sure you are in menopause.  You are having menopausal symptoms and need advice and treatment.  You are still having menstrual periods after age 55 years.  You have pain with intercourse.  Menopause is complete (no menstrual period for 12 months) and you develop vaginal bleeding.  You need a referral to a specialist (gynecologist, psychiatrist, or psychologist) for treatment. SEEK IMMEDIATE MEDICAL CARE IF:   You have severe depression.  You have excessive vaginal bleeding.    You fell and think you have a broken bone.  You have pain when you urinate.  You develop leg or  chest pain.  You have a fast pounding heart beat (palpitations).  You have severe headaches.  You develop vision problems.  You feel a lump in your breast.  You have abdominal pain or severe indigestion. Document Released: 09/02/2003 Document Revised: 02/12/2013 Document Reviewed: 01/09/2013 [employer] Patient Information 2015 [employer]. This information is not intended to replace advice given to you by your health care provider. Make sure you discuss any questions you have with your health care provider. Hormone Therapy At menopause, your body begins making less estrogen and progesterone hormones. This causes the body to stop having menstrual periods. This is because estrogen and progesterone hormones control your periods and menstrual cycle. A lack of estrogen may cause symptoms such as:  Hot flushes (or hot flashes).  Vaginal dryness.  Dry skin.  Loss of sex drive.  Risk of bone loss (osteoporosis). When this happens, you may choose to take hormone therapy to get back the estrogen lost during menopause. When the hormone estrogen is given alone, it is usually referred to as ET (Estrogen Therapy). When the hormone progestin is combined with estrogen, it is generally called HT (Hormone Therapy). This was formerly known as hormone replacement therapy (HRT). Your caregiver can help you make a decision on what will be best for you. The decision to use HT seems to change often as new studies are done. Many studies do not agree on the benefits of hormone replacement therapy. LIKELY BENEFITS OF HT INCLUDE PROTECTION FROM:  Hot Flushes (also called hot flashes) - A hot flush is a sudden feeling of heat that spreads over the face and body. The skin may redden like a blush. It is connected with sweats and sleep disturbance. Women going through menopause may have hot flushes a few times a month or several times per day depending on the woman.  Osteoporosis (bone loss)- Estrogen helps guard  against bone loss. After menopause, a woman's bones slowly lose calcium and become weak and brittle. As a result, bones are more likely to break. The hip, wrist, and spine are affected most often. Hormone therapy can help slow bone loss after menopause. Weight bearing exercise and taking calcium with vitamin D also can help prevent bone loss. There are also medications that your caregiver can prescribe that can help prevent osteoporosis.  Vaginal Dryness - Loss of estrogen causes changes in the vagina. Its lining may become thin and dry. These changes can cause pain and bleeding during sexual intercourse. Dryness can also lead to infections. This can cause burning and itching. (Vaginal estrogen treatment can help relieve pain, itching, and dryness.)  Urinary Tract Infections are more common after menopause because of lack of estrogen. Some women also develop urinary incontinence because of low estrogen levels in the vagina and bladder.  Possible other benefits of estrogen include a positive effect on mood and short-term memory in women. RISKS AND COMPLICATIONS  Using estrogen alone without progesterone causes the lining of the uterus to grow. This increases the risk of lining of the uterus (endometrial) cancer. Your caregiver should give another hormone called progestin if you have a uterus.  Women who take combined (estrogen and progestin) HT appear to have an increased risk of breast cancer. The risk appears to be small, but increases throughout the time that HT is taken.  Combined therapy also makes the breast tissue slightly denser which   makes it harder to read mammograms (breast X-rays).  Combined, estrogen and progesterone therapy can be taken together every day, in which case there may be spotting of blood. HT therapy can be taken cyclically in which case you will have menstrual periods. Cyclically means HT is taken for a set amount of days, then not taken, then this process is repeated.  HT  may increase the risk of stroke, heart attack, breast cancer and forming blood clots in your leg.  Transdermal estrogen (estrogen that is absorbed through the skin with a patch or a cream) may have more positive results with:  Cholesterol.  Blood pressure.  Blood clots. Having the following conditions may indicate you should not have HT:  Endometrial cancer.  Liver disease.  Breast cancer.  Heart disease.  History of blood clots.  Stroke. TREATMENT   If you choose to take HT and have a uterus, usually estrogen and progestin are prescribed.  Your caregiver will help you decide the best way to take the medications.  Possible ways to take estrogen include:  Pills.  Patches.  Gels.  Sprays.  Vaginal estrogen cream, rings and tablets.  It is best to take the lowest dose possible that will help your symptoms and take them for the shortest period of time that you can.  Hormone therapy can help relieve some of the problems (symptoms) that affect women at menopause. Before making a decision about HT, talk to your caregiver about what is best for you. Be well informed and comfortable with your decisions. HOME CARE INSTRUCTIONS   Follow your caregivers advice when taking the medications.  A Pap test is done to screen for cervical cancer.  The first Pap test should be done at age 21.  Between ages 21 and 29, Pap tests are repeated every 2 years.  Beginning at age 30, you are advised to have a Pap test every 3 years as long as your past 3 Pap tests have been normal.  Some women have medical problems that increase the chance of getting cervical cancer. Talk to your caregiver about these problems. It is especially important to talk to your caregiver if a new problem develops soon after your last Pap test. In these cases, your caregiver may recommend more frequent screening and Pap tests.  The above recommendations are the same for women who have or have not gotten the  vaccine for HPV (Human Papillomavirus).  If you had a hysterectomy for a problem that was not a cancer or a condition that could lead to cancer, then you no longer need Pap tests. However, even if you no longer need a Pap test, a regular exam is a good idea to make sure no other problems are starting.   If you are between ages 65 and 70, and you have had normal Pap tests going back 10 years, you no longer need Pap tests. However, even if you no longer need a Pap test, a regular exam is a good idea to make sure no other problems are starting.   If you have had past treatment for cervical cancer or a condition that could lead to cancer, you need Pap tests and screening for cancer for at least 20 years after your treatment.  If Pap tests have been discontinued, risk factors (such as a new sexual partner) need to be re-assessed to determine if screening should be resumed.  Some women may need screenings more often if they are at high risk for cervical cancer.    Get mammograms done as per the advice of your caregiver. SEEK IMMEDIATE MEDICAL CARE IF:  You develop abnormal vaginal bleeding.  You have pain or swelling in your legs, shortness of breath, or chest pain.  You develop dizziness or headaches.  You have lumps or changes in your breasts or armpits.  You have slurred speech.  You develop weakness or numbness of your arms or legs.  You have pain, burning, or bleeding when urinating.  You develop abdominal pain. Document Released: 03/11/2003 Document Revised: 09/04/2011 Document Reviewed: 06/29/2010 [employer] Patient Information 2015 [employer]. This information is not intended to replace advice given to you by your health care provider. Make sure you discuss any questions you have with your health care provider.  

## 2014-12-24 LAB — URINALYSIS W MICROSCOPIC + REFLEX CULTURE
Bacteria, UA: NONE SEEN
Bilirubin Urine: NEGATIVE
Casts: NONE SEEN
Crystals: NONE SEEN
Glucose, UA: NEGATIVE mg/dL
HGB URINE DIPSTICK: NEGATIVE
Ketones, ur: NEGATIVE mg/dL
LEUKOCYTES UA: NEGATIVE
NITRITE: NEGATIVE
PROTEIN: NEGATIVE mg/dL
SQUAMOUS EPITHELIAL / LPF: NONE SEEN
Specific Gravity, Urine: 1.006 (ref 1.005–1.030)
UROBILINOGEN UA: 0.2 mg/dL (ref 0.0–1.0)
pH: 5.5 (ref 5.0–8.0)

## 2014-12-24 LAB — CYTOLOGY - PAP

## 2014-12-24 LAB — FOLLICLE STIMULATING HORMONE: FSH: 4.1 m[IU]/mL

## 2014-12-25 ENCOUNTER — Encounter: Payer: Managed Care, Other (non HMO) | Admitting: Gynecology

## 2015-01-12 ENCOUNTER — Other Ambulatory Visit: Payer: Self-pay | Admitting: Gynecology

## 2015-01-12 DIAGNOSIS — N951 Menopausal and female climacteric states: Secondary | ICD-10-CM

## 2015-01-19 ENCOUNTER — Other Ambulatory Visit: Payer: Self-pay | Admitting: Gynecology

## 2015-01-27 ENCOUNTER — Telehealth: Payer: Self-pay | Admitting: *Deleted

## 2015-01-27 ENCOUNTER — Other Ambulatory Visit: Payer: Self-pay | Admitting: Gynecology

## 2015-01-27 MED ORDER — FLUOXETINE HCL 20 MG PO CAPS: ORAL_CAPSULE | ORAL | Status: DC

## 2015-01-27 NOTE — Telephone Encounter (Signed)
Error the below should be Prozac 20 mg, Rx sent.

## 2015-01-27 NOTE — Telephone Encounter (Signed)
Pt called requesting refill Prozac 10 mg per note:   "She's ready to start on hormone replacement therapy and taper off the Prozac. She is going to be prescribed Estrace 1 mg daily with the addition of Prometrium 200 mg for 12 days of the month. In over the course of the month wearing 1 to taper off her 20 mg Prozac.  Please advise

## 2015-01-27 NOTE — Telephone Encounter (Signed)
30 tablets

## 2015-03-17 ENCOUNTER — Other Ambulatory Visit: Payer: Self-pay | Admitting: Gynecology

## 2015-03-17 NOTE — Telephone Encounter (Signed)
01/27/15 note indicated patient was to be tapering/weaning off of this.

## 2016-11-08 ENCOUNTER — Encounter: Payer: Self-pay | Admitting: Gynecology
